# Patient Record
Sex: Male | Born: 1960 | Race: White | Hispanic: No | Marital: Married | Smoking: Never smoker
Health system: Southern US, Community
[De-identification: ages and names within clinical notes are randomized; demographics above are authoritative.]

## PROBLEM LIST (undated history)

## (undated) DIAGNOSIS — K5792 Diverticulitis of intestine, part unspecified, without perforation or abscess without bleeding: Secondary | ICD-10-CM

## (undated) DIAGNOSIS — C801 Malignant (primary) neoplasm, unspecified: Secondary | ICD-10-CM

## (undated) DIAGNOSIS — I10 Essential (primary) hypertension: Secondary | ICD-10-CM

## (undated) DIAGNOSIS — K529 Noninfective gastroenteritis and colitis, unspecified: Secondary | ICD-10-CM

---

## 2008-04-14 ENCOUNTER — Emergency Department (HOSPITAL_COMMUNITY): Admission: EM | Admit: 2008-04-14 | Discharge: 2008-04-14 | Payer: Self-pay | Admitting: Emergency Medicine

## 2011-06-04 LAB — URINALYSIS, ROUTINE W REFLEX MICROSCOPIC
Glucose, UA: NEGATIVE
Hgb urine dipstick: NEGATIVE
Ketones, ur: NEGATIVE
pH: 6

## 2011-06-04 LAB — DIFFERENTIAL
Basophils Absolute: 0
Basophils Relative: 0
Neutro Abs: 9 — ABNORMAL HIGH
Neutrophils Relative %: 76

## 2011-06-04 LAB — CBC
HCT: 43.8
Hemoglobin: 14.8
MCV: 93.3
RDW: 12.6

## 2011-06-04 LAB — COMPREHENSIVE METABOLIC PANEL
Alkaline Phosphatase: 34 — ABNORMAL LOW
BUN: 16
Chloride: 102
Creatinine, Ser: 1.05
Glucose, Bld: 105 — ABNORMAL HIGH
Potassium: 4.2
Total Bilirubin: 2.1 — ABNORMAL HIGH
Total Protein: 6.9

## 2011-06-04 LAB — LIPASE, BLOOD: Lipase: 15

## 2017-07-28 ENCOUNTER — Other Ambulatory Visit: Payer: Self-pay

## 2017-07-28 ENCOUNTER — Inpatient Hospital Stay (HOSPITAL_COMMUNITY): Payer: Managed Care, Other (non HMO)

## 2017-07-28 ENCOUNTER — Inpatient Hospital Stay (HOSPITAL_COMMUNITY)
Admission: EM | Admit: 2017-07-28 | Discharge: 2017-08-01 | DRG: 389 | Disposition: A | Discharge status: Home / Self Care | Payer: Managed Care, Other (non HMO) | Attending: General Surgery | Admitting: General Surgery

## 2017-07-28 ENCOUNTER — Encounter (HOSPITAL_COMMUNITY): Payer: Self-pay

## 2017-07-28 ENCOUNTER — Emergency Department (HOSPITAL_COMMUNITY): Payer: Managed Care, Other (non HMO)

## 2017-07-28 DIAGNOSIS — I10 Essential (primary) hypertension: Secondary | ICD-10-CM | POA: Diagnosis present

## 2017-07-28 DIAGNOSIS — Z887 Allergy status to serum and vaccine status: Secondary | ICD-10-CM

## 2017-07-28 DIAGNOSIS — K56609 Unspecified intestinal obstruction, unspecified as to partial versus complete obstruction: Secondary | ICD-10-CM | POA: Diagnosis present

## 2017-07-28 DIAGNOSIS — Z0189 Encounter for other specified special examinations: Secondary | ICD-10-CM

## 2017-07-28 DIAGNOSIS — Z91040 Latex allergy status: Secondary | ICD-10-CM | POA: Diagnosis not present

## 2017-07-28 DIAGNOSIS — Z888 Allergy status to other drugs, medicaments and biological substances status: Secondary | ICD-10-CM | POA: Diagnosis not present

## 2017-07-28 DIAGNOSIS — K565 Intestinal adhesions [bands], unspecified as to partial versus complete obstruction: Secondary | ICD-10-CM | POA: Diagnosis present

## 2017-07-28 DIAGNOSIS — C49A9 Gastrointestinal stromal tumor of other sites: Secondary | ICD-10-CM | POA: Diagnosis present

## 2017-07-28 HISTORY — DX: Diverticulitis of intestine, part unspecified, without perforation or abscess without bleeding: K57.92

## 2017-07-28 HISTORY — DX: Noninfective gastroenteritis and colitis, unspecified: K52.9

## 2017-07-28 HISTORY — DX: Essential (primary) hypertension: I10

## 2017-07-28 HISTORY — DX: Malignant (primary) neoplasm, unspecified: C80.1

## 2017-07-28 LAB — CBC
HEMATOCRIT: 45.6 % (ref 39.0–52.0)
HEMOGLOBIN: 16.1 g/dL (ref 13.0–17.0)
MCH: 32.7 pg (ref 26.0–34.0)
MCHC: 35.3 g/dL (ref 30.0–36.0)
MCV: 92.5 fL (ref 78.0–100.0)
Platelets: 227 10*3/uL (ref 150–400)
RBC: 4.93 MIL/uL (ref 4.22–5.81)
RDW: 12.6 % (ref 11.5–15.5)
WBC: 12.6 10*3/uL — AB (ref 4.0–10.5)

## 2017-07-28 LAB — COMPREHENSIVE METABOLIC PANEL
ALBUMIN: 4.8 g/dL (ref 3.5–5.0)
ALT: 27 U/L (ref 17–63)
ANION GAP: 12 (ref 5–15)
AST: 26 U/L (ref 15–41)
Alkaline Phosphatase: 47 U/L (ref 38–126)
BILIRUBIN TOTAL: 1.6 mg/dL — AB (ref 0.3–1.2)
BUN: 15 mg/dL (ref 6–20)
CO2: 21 mmol/L — ABNORMAL LOW (ref 22–32)
Calcium: 9.3 mg/dL (ref 8.9–10.3)
Chloride: 103 mmol/L (ref 101–111)
Creatinine, Ser: 1.01 mg/dL (ref 0.61–1.24)
GFR calc non Af Amer: >60 mL/min (ref >60)
GLUCOSE: 106 mg/dL — AB (ref 65–99)
POTASSIUM: 3.2 mmol/L — AB (ref 3.5–5.1)
SODIUM: 136 mmol/L (ref 135–145)
TOTAL PROTEIN: 7.3 g/dL (ref 6.5–8.1)

## 2017-07-28 LAB — URINALYSIS, ROUTINE W REFLEX MICROSCOPIC
Bilirubin Urine: NEGATIVE
Glucose, UA: NEGATIVE mg/dL
Hgb urine dipstick: NEGATIVE
Ketones, ur: 5 mg/dL — AB
LEUKOCYTES UA: NEGATIVE
NITRITE: NEGATIVE
PH: 6 (ref 5.0–8.0)
Protein, ur: NEGATIVE mg/dL
SPECIFIC GRAVITY, URINE: 1.024 (ref 1.005–1.030)

## 2017-07-28 LAB — I-STAT CHEM 8, ED
BUN: 15 mg/dL (ref 6–20)
CHLORIDE: 103 mmol/L (ref 101–111)
CREATININE: 0.9 mg/dL (ref 0.61–1.24)
Calcium, Ion: 1.11 mmol/L — ABNORMAL LOW (ref 1.15–1.40)
Glucose, Bld: 104 mg/dL — ABNORMAL HIGH (ref 65–99)
HEMATOCRIT: 48 % (ref 39.0–52.0)
Hemoglobin: 16.3 g/dL (ref 13.0–17.0)
POTASSIUM: 3.2 mmol/L — AB (ref 3.5–5.1)
SODIUM: 138 mmol/L (ref 135–145)
TCO2: 23 mmol/L (ref 22–32)

## 2017-07-28 LAB — LIPASE, BLOOD: Lipase: 18 U/L (ref 11–51)

## 2017-07-28 MED ORDER — SODIUM CHLORIDE 0.9 % IV BOLUS (SEPSIS)
1000.0000 mL | Freq: Once | INTRAVENOUS | Status: AC
  Administered 2017-07-28: 1000 mL via INTRAVENOUS

## 2017-07-28 MED ORDER — ACETAMINOPHEN 325 MG PO TABS
650.0000 mg | ORAL_TABLET | Freq: Four times a day (QID) | ORAL | Status: DC | PRN
  Administered 2017-07-29 – 2017-08-01 (×10): 650 mg via ORAL
  Filled 2017-07-28 (×11): qty 2

## 2017-07-28 MED ORDER — ENOXAPARIN SODIUM 40 MG/0.4ML ~~LOC~~ SOLN
40.0000 mg | SUBCUTANEOUS | Status: DC
  Administered 2017-07-29 – 2017-07-31 (×3): 40 mg via SUBCUTANEOUS
  Filled 2017-07-28 (×3): qty 0.4

## 2017-07-28 MED ORDER — MORPHINE SULFATE (PF) 4 MG/ML IV SOLN
4.0000 mg | Freq: Once | INTRAVENOUS | Status: AC
  Administered 2017-07-28: 4 mg via INTRAVENOUS
  Filled 2017-07-28: qty 1

## 2017-07-28 MED ORDER — SODIUM CHLORIDE 0.9 % IV SOLN
INTRAVENOUS | Status: DC
  Administered 2017-07-28 – 2017-07-29 (×2): via INTRAVENOUS

## 2017-07-28 MED ORDER — POTASSIUM CHLORIDE 10 MEQ/100ML IV SOLN
10.0000 meq | INTRAVENOUS | Status: AC
  Administered 2017-07-29 (×4): 10 meq via INTRAVENOUS
  Filled 2017-07-28 (×4): qty 100

## 2017-07-28 MED ORDER — LIDOCAINE HCL 2 % EX GEL
1.0000 "application " | Freq: Once | CUTANEOUS | Status: AC
  Administered 2017-07-28: 1 via TOPICAL
  Filled 2017-07-28: qty 20

## 2017-07-28 MED ORDER — HYDROMORPHONE HCL 1 MG/ML IJ SOLN
1.0000 mg | INTRAMUSCULAR | Status: DC | PRN
  Administered 2017-07-29 (×2): 1 mg via INTRAVENOUS
  Filled 2017-07-28 (×2): qty 1

## 2017-07-28 MED ORDER — ONDANSETRON HCL 4 MG/2ML IJ SOLN
4.0000 mg | Freq: Four times a day (QID) | INTRAMUSCULAR | Status: DC | PRN
  Administered 2017-07-28 – 2017-07-29 (×2): 4 mg via INTRAVENOUS
  Filled 2017-07-28 (×2): qty 2

## 2017-07-28 MED ORDER — ONDANSETRON HCL 4 MG/2ML IJ SOLN
4.0000 mg | Freq: Once | INTRAMUSCULAR | Status: AC
  Administered 2017-07-28: 4 mg via INTRAVENOUS
  Filled 2017-07-28: qty 2

## 2017-07-28 MED ORDER — ONDANSETRON 4 MG PO TBDP: 4.0000 mg | ORAL_TABLET | Freq: Four times a day (QID) | ORAL | Status: DC | PRN

## 2017-07-28 MED ORDER — ACETAMINOPHEN 650 MG RE SUPP: 650.0000 mg | Freq: Four times a day (QID) | RECTAL | Status: DC | PRN

## 2017-07-28 MED ORDER — DIATRIZOATE MEGLUMINE & SODIUM 66-10 % PO SOLN
90.0000 mL | Freq: Once | ORAL | Status: AC
  Administered 2017-07-29: 90 mL via NASOGASTRIC
  Filled 2017-07-28: qty 90

## 2017-07-28 NOTE — ED Notes (Signed)
Patient is stable and ready to be transport to the floor at this time.  Report was called to 6N RN.  Belongings taken with the patient to the floor.   

## 2017-07-28 NOTE — ED Triage Notes (Signed)
Pt states he has abdominal discomfort today around 1100. He has hx of diverticulitis and colitis. Pt reports normal bowel and bladder function. Pt also has hx of Cdiff,

## 2017-07-28 NOTE — ED Provider Notes (Signed)
[place] EMERGENCY DEPARTMENT Provider Note   CSN: [id number] Arrival date & time: 07/28/17  1601     History   Chief Complaint Chief Complaint  Patient presents with  . Abdominal Pain    HPI Antonio Yates is a 56 y.o. male.  56 yo M with a chief complaint of abdominal pain.  Started to the upper abdomen and now is diffusely lower.  Patient has had pain like this before and thought to be colitis or diverticulitis.  He denies fevers denies vomiting.  This been going on for about 6 hours now.  Gradually worsening.  Has had 2 bowel movements that were not dark or bloody.  Denies nausea.  The patient has a significant past medical history for GIST tumor, with multiple prior abdominal surgeries.  Passing flatus   The history is provided by the patient.  Abdominal Pain   This is a new problem. The current episode started 3 to 5 hours ago. The problem occurs constantly. The problem has been gradually worsening. The pain is located in the generalized abdominal region, LLQ and RLQ. The quality of the pain is sharp, shooting and cramping. The pain is at a severity of 9/10. The pain is severe. Pertinent negatives include fever, diarrhea, nausea, vomiting, headaches, arthralgias and myalgias. Nothing aggravates the symptoms. Nothing relieves the symptoms.    Past Medical History:  Diagnosis Date  . Cancer [facility])    GIST  . Colitis, acute   . Diverticulitis   . Hypertension     There are no active problems to display for this patient.   History reviewed. No pertinent surgical history.     Home Medications    Prior to Admission medications   Medication Sig Start Date End Date Taking? Authorizing Provider  amLODipine (NORVASC) 10 MG tablet Take 10 mg by mouth daily. 06/01/17  Yes [provider]  aspirin EC 81 MG tablet Take 81 mg by mouth daily.   Yes [provider]  potassium chloride SA (K-DUR,KLOR-CON) 20 MEQ tablet Take 20 mEq by mouth  daily. 07/03/17  Yes [provider]  STIVARGA 40 MG tablet Take 160 mg by mouth daily with breakfast.  06/29/17  Yes [provider]    Family History History reviewed. No pertinent family history.  Social History Social History   Tobacco Use  . Smoking status: Never Smoker  . Smokeless tobacco: Never Used  Substance Use Topics  . Alcohol use: Yes    Frequency: Never    Comment: occ  . Drug use: Not on file     Allergies   Flagyl er [metronidazole]; Tetanus toxoids; Theodrenaline; and Latex   Review of Systems Review of Systems  Constitutional: Negative for chills and fever.  HENT: Negative for congestion and facial swelling.   Eyes: Negative for discharge and visual disturbance.  Respiratory: Negative for shortness of breath.   Cardiovascular: Negative for chest pain and palpitations.  Gastrointestinal: Positive for abdominal pain. Negative for diarrhea, nausea and vomiting.  Musculoskeletal: Negative for arthralgias and myalgias.  Skin: Negative for color change and rash.  Neurological: Negative for tremors, syncope and headaches.  Psychiatric/Behavioral: Negative for confusion and dysphoric mood.     Physical Exam Updated Vital Signs BP (!) 169/98   Pulse 99   Temp 97.7 F (36.5 C) (Oral)   Resp 19   SpO2 97%   Physical Exam  Constitutional: He is oriented to person, place, and time. He appears well-developed and well-nourished.  HENT:  Head: Normocephalic and atraumatic.  Eyes: EOM are normal. Pupils are equal, round, and reactive to light.  Neck: Normal range of motion. Neck supple. No JVD present.  Cardiovascular: Normal rate and regular rhythm. Exam reveals no gallop and no friction rub.  No murmur heard. Pulmonary/Chest: No respiratory distress. He has no wheezes.  Abdominal: He exhibits no distension. There is tenderness in the right lower quadrant. There is no rebound, no guarding, no tenderness at McBurney's point and negative  Murphy's sign.  Large midline abdominal scar  Musculoskeletal: Normal range of motion.  Neurological: He is alert and oriented to person, place, and time.  Skin: No rash noted. No pallor.  Psychiatric: He has a normal mood and affect. His behavior is normal.  Nursing note and vitals reviewed.    ED Treatments / Results  Labs (all labs ordered are listed, but only abnormal results are displayed) Labs Reviewed  COMPREHENSIVE METABOLIC PANEL - Abnormal; Notable for the following components:      Result Value   Potassium 3.2 (*)    CO2 21 (*)    Glucose, Bld 106 (*)    Total Bilirubin 1.6 (*)    All other components within normal limits  CBC - Abnormal; Notable for the following components:   WBC 12.6 (*)    All other components within normal limits  URINALYSIS, ROUTINE W REFLEX MICROSCOPIC - Abnormal; Notable for the following components:   Color, Urine STRAW (*)    Ketones, ur 5 (*)    All other components within normal limits  I-STAT CHEM 8, ED - Abnormal; Notable for the following components:   Potassium 3.2 (*)    Glucose, Bld 104 (*)    Calcium, Ion 1.11 (*)    All other components within normal limits  LIPASE, BLOOD    EKG  EKG Interpretation None       Radiology Ct Abdomen Pelvis W Contrast  Result Date: 07/28/2017 CLINICAL DATA:  Unspecified abdominal pain. Acute low abdominal pain. History of gastrointestinal stromal tumor with bowel resections. EXAM: CT ABDOMEN AND PELVIS WITH CONTRAST TECHNIQUE: Multidetector CT imaging of the abdomen and pelvis was performed using the standard protocol following bolus administration of intravenous contrast. CONTRAST:  100 cc Isovue-300 intravenous COMPARISON:  04/14/2008. Report from 07/04/2017 at [place]. FINDINGS: Lower chest: No acute finding. Fatty scarring at the left ventricular apex raises the possibility of remote infarct, although not convincingly sub endocardial or thinned. Hepatobiliary: No visible  metastatic disease.Cholecystectomy with normal common bile duct diameter. Pancreas: Unremarkable. Spleen: Unremarkable. Adrenals/Urinary Tract: Negative adrenals. No hydronephrosis or stone. 1 cm cyst in the upper pole right kidney. Mild malrotation of the right kidney. Mass in the low right pelvis is indistinguishable from the right bladder wall, as below. Stomach/Bowel: There is a right lower quadrant bowel sutures, presumably enteroenteric or enterocolonic anastomosis related to history of gist. At or just proximal to this point there is small bowel distention with fluid levels and distal fecalized contents. No visible mass at the level of obstruction. There is a mass measuring 3.2 cm in the right pelvis, along the right urinary bladder. Colonic diverticulosis, overall mild. Vascular/Lymphatic: Atherosclerotic calcification of the aorta. No acute vascular finding. Reproductive:Negative Other: Small volume peritoneal fluid in the right lower quadrant which is considered reactive. Musculoskeletal: No acute or aggressive finding. IMPRESSION: 1. Small bowel obstruction with transition point in the right lower quadrant at or near bowel sutures. This could reflect stricture or adhesive disease. No masslike enhancement  at the level of obstruction. 2. History of gastrointestinal stromal tumor. There is a 3.2 cm soft tissue mass in the right lower quadrant indistinguishable from the right bladder wall. This mass is known and enlarging based on an abdominal CT report 07/04/2017 from [place]. 3. Chronic findings are noted above. Electronically Signed   By: Monte Fantasia M.D.   On: 07/28/2017 18:39    Procedures Procedures (including critical care time)  Medications Ordered in ED Medications  sodium chloride 0.9 % bolus 1,000 mL (0 mLs Intravenous Stopped 07/28/17 1939)  morphine 4 MG/ML injection 4 mg (4 mg Intravenous Given 07/28/17 1657)  ondansetron (ZOFRAN) injection 4 mg (4 mg Intravenous Given 07/28/17 1657)   morphine 4 MG/ML injection 4 mg (4 mg Intravenous Given 07/28/17 1927)  ondansetron (ZOFRAN) injection 4 mg (4 mg Intravenous Given 07/28/17 1927)     Initial Impression / Assessment and Plan / ED Course  I have reviewed the triage vital signs and the nursing notes.  Pertinent labs & imaging results that were available during my care of the patient were reviewed by me and considered in my medical decision making (see chart for details).     56 yo  M with a chief complaint of diffuse abdominal pain.  Started about 6 hours ago and progressively worsening.  Denies fevers nausea or vomiting.  The patient has severe pain to the right lower quadrant.  With his history of abdominal cancer will obtain a CT scan.  CT with SBO.  Discussed with Gen surgery.  Will admit.   The patients results and plan were reviewed and discussed.   Any x-rays performed were independently reviewed by myself.   Differential diagnosis were considered with the presenting HPI.  Medications  sodium chloride 0.9 % bolus 1,000 mL (0 mLs Intravenous Stopped 07/28/17 1939)  morphine 4 MG/ML injection 4 mg (4 mg Intravenous Given 07/28/17 1657)  ondansetron (ZOFRAN) injection 4 mg (4 mg Intravenous Given 07/28/17 1657)  morphine 4 MG/ML injection 4 mg (4 mg Intravenous Given 07/28/17 1927)  ondansetron (ZOFRAN) injection 4 mg (4 mg Intravenous Given 07/28/17 1927)    Vitals:   07/28/17 1645 07/28/17 1730 07/28/17 1931 07/28/17 1932  BP: (!) 166/89 (!) 168/92 (!) 169/98   Pulse: 90 91 89 99  Resp:   19   Temp:      TempSrc:      SpO2: 98% 96% 99% 97%    Final diagnoses:  SBO (small bowel obstruction) (HCC)    Admission/ observation were discussed with the admitting physician, patient and/or family and they are comfortable with the plan.    Final Clinical Impressions(s) / ED Diagnoses   Final diagnoses:  SBO (small bowel obstruction) [facility])    ED Discharge Orders    None       Deno Etienne,  DO 07/28/17 2016

## 2017-07-28 NOTE — H&P (Signed)
Antonio Yates is an 56 y.o. male.   Chief Complaint: abdominal pain HPI: 39 yom with complicated surgical history due to small bowel GIST.  Here is history from [facility] 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  01/28/2012: CT scan: a 5.6x4.8x7cm mass in the left central mesentery involving several small bowel, several nodules throughout the omentum concerning for peritoneal carcinomatosis  02/11/2012: exploratory laparotomy with resection of tumor and small intestine, small intestinal reanastomosis  Pathology showed high grade GIST, 5cm, 20-30/50 mitoses per high power field, 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 diff, treated with metronidazole and vancomycin  03/09/2012 - 03/15/2012: adjuvant imatinib 435m  Stopped due to drug rash, unclear if it was secondary to vancomycin or imatinib  04/05/2012 - 04/07/2012: imatinib 2039mstarted  04/08/2012: started imatinib 40082maily with plan for 3 years (planned end on 04/12/2015)  04/14/2015: Resection of intraabdominal tumor 5-10 cm and cholecystectomy  12/29/2015: recurrence on imaging  01/05/2016: started imatinib 400 mg BID  10/11/2016: CT AP showed mild growth of some intraperitoneal nodules  11/30/2016: resection of intrabdominal tumors, partial bladder resection. These are path results:  -1. Abdominal wall, nodule, excision:   - Gastrointestinal stromal tumor, spindle cell type, with therapy related   changes forming a 0.3 cm nodule.   - Approximately 5% of the tumor  cells are viable.    2. Tumor and bladder wall, excision:   - Multifocal gastrointestinal stromal tumor, spindle cell type, with   minimal therapy related changes, largest measuring 3.4 cm, involving   detrusor muscle and perivesicular adipose tissue.    Approximately 90% of the tumor cells are viable.   - Mitotic rate is 25 mitoses per 5mm27m  - Overlying bladder mucosa with reactive changes.    3. Mesentery, nodule, excision:   - Gastrointestinal stromal tumor, spindle cell type, with therapy related   changes forming a 0.5 cm nodule.   - Approximately 2% of the tumor cells are viable.    4. Peritoneum, nodules, excision:   - Multiple gastrointestinal stromal tumors, spindle cell type, with therapy   related changes, forming 10 discrete nodules ranging from 0.5 to 2.6 cm in   greatest dimension.   - Variable tumor viabiliy, ranging from 5% to 40%.   - Mitotic rate is 1 mitosis per 5mm243m  5. Bladder, additional nodule, excision:   - Gastrointestinal stromal tumor, spindle cell type, with therapy related   changes forming a 0.9 cm nodule.   - Approximately 5% of the tumor cells are viable.    Complicated by small bowel obstruction that was managed conservatively with eventual peg placement and tpn   Right now he is on regorafenib as 2nd line agent for gist.  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  [facility]  Late this morning he began having abdominal pain that was persisted. He has had two bms but is not passing flatus. Had some nausea, no emesis.  Came to er for evaluation given above history.  His ct  scan here shows following: 1. Small bowel obstruction with transition point in the right lower quadrant at or near bowel sutures. This could reflect stricture or adhesive disease. No masslike enhancement at the level of obstruction. 2. History of gastrointestinal stromal tumor. There is a 3.2 cm soft tissue mass in the right  lower quadrant indistinguishable from the right bladder wall. This mass is known and enlarging based on an abdominal CT report 07/04/2017 from [place].  Past Medical History:  Diagnosis Date  . Cancer [facility])    GIST  . Colitis, acute   . Diverticulitis   . Hypertension   history c diff  PSH: see above, complicated abdominal surgical history  History reviewed. No pertinent family history.   Social History:  reports that  has never smoked. he has never used smokeless tobacco. He reports that he drinks alcohol. His drug history is not on file.  Allergies:  Allergies  Allergen Reactions  . Flagyl Er [Metronidazole] Other (See Comments)    Red man syndrome  . Tetanus Toxoids Swelling    Severe swelling at the injection site  . Theodrenaline Other (See Comments)    Possible elevated HR  . Latex Rash    Meds reviewed  Results for orders placed or performed during the hospital encounter of 07/28/17 (from the past 48 hour(s))  Lipase, blood     Status: None   Collection Time: 07/28/17  4:28 PM  Result Value Ref Range   Lipase 18 11 - 51 U/L  Comprehensive metabolic panel     Status: Abnormal   Collection Time: 07/28/17  4:28 PM  Result Value Ref Range   Sodium 136 135 - 145 mmol/L   Potassium 3.2 (L) 3.5 - 5.1 mmol/L   Chloride 103 101 - 111 mmol/L   CO2 21 (L) 22 - 32 mmol/L   Glucose, Bld 106 (H) 65 - 99 mg/dL   BUN 15 6 - 20 mg/dL   Creatinine, Ser 1.01 0.61 - 1.24 mg/dL   Calcium 9.3 8.9 - 10.3 mg/dL   Total Protein 7.3 6.5 - 8.1 g/dL   Albumin 4.8 3.5 - 5.0 g/dL   AST 26 15 - 41 U/L   ALT 27 17 - 63 U/L   Alkaline Phosphatase 47 38 - 126 U/L   Total Bilirubin 1.6 (H) 0.3 - 1.2 mg/dL   GFR calc non Af Amer >60 >60 mL/min   GFR calc Af Amer >60 >60 mL/min    Comment: (NOTE) The eGFR has been calculated using the CKD EPI equation. This calculation has not been validated in all clinical situations. eGFR's persistently <60 mL/min signify possible Chronic  Kidney Disease.    Anion gap 12 5 - 15  CBC     Status: Abnormal   Collection Time: 07/28/17  4:28 PM  Result Value Ref Range   WBC 12.6 (H) 4.0 - 10.5 K/uL   RBC 4.93 4.22 - 5.81 MIL/uL   Hemoglobin 16.1 13.0 - 17.0 g/dL   HCT 45.6 39.0 - 52.0 %   MCV 92.5 78.0 - 100.0 fL   MCH 32.7 26.0 - 34.0 pg   MCHC 35.3 30.0 - 36.0 g/dL   RDW 12.6 11.5 - 15.5 %   Platelets 227 150 - 400 K/uL  I-Stat Chem 8, ED     Status: Abnormal   Collection Time: 07/28/17  4:52 PM  Result Value Ref Range   Sodium 138 135 - 145 mmol/L   Potassium 3.2 (L) 3.5 - 5.1 mmol/L  Chloride 103 101 - 111 mmol/L   BUN 15 6 - 20 mg/dL   Creatinine, Ser 0.90 0.61 - 1.24 mg/dL   Glucose, Bld 104 (H) 65 - 99 mg/dL   Calcium, Ion 1.11 (L) 1.15 - 1.40 mmol/L   TCO2 23 22 - 32 mmol/L   Hemoglobin 16.3 13.0 - 17.0 g/dL   HCT 48.0 39.0 - 52.0 %  Urinalysis, Routine w reflex microscopic     Status: Abnormal   Collection Time: 07/28/17  7:36 PM  Result Value Ref Range   Color, Urine STRAW (A) YELLOW   APPearance CLEAR CLEAR   Specific Gravity, Urine 1.024 1.005 - 1.030   pH 6.0 5.0 - 8.0   Glucose, UA NEGATIVE NEGATIVE mg/dL   Hgb urine dipstick NEGATIVE NEGATIVE   Bilirubin Urine NEGATIVE NEGATIVE   Ketones, ur 5 (A) NEGATIVE mg/dL   Protein, ur NEGATIVE NEGATIVE mg/dL   Nitrite NEGATIVE NEGATIVE   Leukocytes, UA NEGATIVE NEGATIVE   Ct Abdomen Pelvis W Contrast  Result Date: 07/28/2017 CLINICAL DATA:  Unspecified abdominal pain. Acute low abdominal pain. History of gastrointestinal stromal tumor with bowel resections. EXAM: CT ABDOMEN AND PELVIS WITH CONTRAST TECHNIQUE: Multidetector CT imaging of the abdomen and pelvis was performed using the standard protocol following bolus administration of intravenous contrast. CONTRAST:  100 cc Isovue-300 intravenous COMPARISON:  04/14/2008. Report from 07/04/2017 at [place]. FINDINGS: Lower chest: No acute finding. Fatty scarring at the left ventricular  apex raises the possibility of remote infarct, although not convincingly sub endocardial or thinned. Hepatobiliary: No visible metastatic disease.Cholecystectomy with normal common bile duct diameter. Pancreas: Unremarkable. Spleen: Unremarkable. Adrenals/Urinary Tract: Negative adrenals. No hydronephrosis or stone. 1 cm cyst in the upper pole right kidney. Mild malrotation of the right kidney. Mass in the low right pelvis is indistinguishable from the right bladder wall, as below. Stomach/Bowel: There is a right lower quadrant bowel sutures, presumably enteroenteric or enterocolonic anastomosis related to history of gist. At or just proximal to this point there is small bowel distention with fluid levels and distal fecalized contents. No visible mass at the level of obstruction. There is a mass measuring 3.2 cm in the right pelvis, along the right urinary bladder. Colonic diverticulosis, overall mild. Vascular/Lymphatic: Atherosclerotic calcification of the aorta. No acute vascular finding. Reproductive:Negative Other: Small volume peritoneal fluid in the right lower quadrant which is considered reactive. Musculoskeletal: No acute or aggressive finding. IMPRESSION: 1. Small bowel obstruction with transition point in the right lower quadrant at or near bowel sutures. This could reflect stricture or adhesive disease. No masslike enhancement at the level of obstruction. 2. History of gastrointestinal stromal tumor. There is a 3.2 cm soft tissue mass in the right lower quadrant indistinguishable from the right bladder wall. This mass is known and enlarging based on an abdominal CT report 07/04/2017 from [place]. 3. Chronic findings are noted above. Electronically Signed   By: Monte Fantasia M.D.   On: 07/28/2017 18:39    Review of Systems  Constitutional: Negative for chills and fever.  Gastrointestinal: Positive for abdominal pain and nausea. Negative for blood in stool, diarrhea and vomiting.  All other systems  reviewed and are negative.   Blood pressure (!) 169/98, pulse 99, temperature 97.7 F (36.5 C), temperature source Oral, resp. rate 19, SpO2 97 %. Physical Exam  Vitals reviewed. Constitutional: He is oriented to person, place, and time. He appears well-developed and well-nourished.  HENT:  Head: Normocephalic and atraumatic.  Mouth/Throat: Oropharynx is clear  and moist.  Eyes: No scleral icterus.  Neck: Neck supple.  Cardiovascular: Normal rate, regular rhythm, normal heart sounds and intact distal pulses.  Respiratory: Effort normal and breath sounds normal. He has no wheezes. He has no rales.  GI: He exhibits distension (mild lower abdomen). Bowel sounds are decreased. There is tenderness in the right lower quadrant. There is no rigidity, no rebound and no guarding. No hernia.    Lymphadenopathy:    He has no cervical adenopathy.  Neurological: He is alert and oriented to person, place, and time.  Skin: Skin is warm and dry. He is not diaphoretic.  Psychiatric: He has a normal mood and affect. His behavior is normal.     Assessment/Plan SBO  Has complicated surgical history with stage IV GIST managed at [facility]. Was being followed for pelvic nodule which looks like might be larger now.  The obstruction appears to be rlq and may be adhesion related as he has history of this and is certainly at risk for sbo secondary to adhesions. I do not think he needs surgery right now.  I do think conservative mgt of this hopefully adhesive sbo is appropriate. I discussed ng placement today with gastrograffin protocol to see if this resolves.  We discussed indications for surgery being worsening or no improvement over time.  We can certainly try to discuss with his surgeon at home. Ideally having him back at home for surgery given history would be appropriate as he has been managed for years with this recurrent stage IV GIST.   I discussed this plan with he and his wife.    Rolm Bookbinder,  MD 07/28/2017, 8:27 PM

## 2017-07-28 NOTE — ED Notes (Signed)
Patient taken to CT.

## 2017-07-29 ENCOUNTER — Inpatient Hospital Stay (HOSPITAL_COMMUNITY): Payer: Managed Care, Other (non HMO)

## 2017-07-29 LAB — BASIC METABOLIC PANEL
Anion gap: 10 (ref 5–15)
BUN: 10 mg/dL (ref 6–20)
CHLORIDE: 101 mmol/L (ref 101–111)
CO2: 25 mmol/L (ref 22–32)
CREATININE: 0.93 mg/dL (ref 0.61–1.24)
Calcium: 8.5 mg/dL — ABNORMAL LOW (ref 8.9–10.3)
GFR calc non Af Amer: >60 mL/min (ref >60)
GLUCOSE: 131 mg/dL — AB (ref 65–99)
Potassium: 3.6 mmol/L (ref 3.5–5.1)
Sodium: 136 mmol/L (ref 135–145)

## 2017-07-29 LAB — CBC
HCT: 44.5 % (ref 39.0–52.0)
Hemoglobin: 15.5 g/dL (ref 13.0–17.0)
MCH: 32.4 pg (ref 26.0–34.0)
MCHC: 34.8 g/dL (ref 30.0–36.0)
MCV: 92.9 fL (ref 78.0–100.0)
Platelets: 215 10*3/uL (ref 150–400)
RBC: 4.79 MIL/uL (ref 4.22–5.81)
RDW: 12.5 % (ref 11.5–15.5)
WBC: 12.3 10*3/uL — ABNORMAL HIGH (ref 4.0–10.5)

## 2017-07-29 MED ORDER — CHLORHEXIDINE GLUCONATE 0.12 % MT SOLN
15.0000 mL | Freq: Two times a day (BID) | OROMUCOSAL | Status: DC
  Administered 2017-07-29 – 2017-07-31 (×6): 15 mL via OROMUCOSAL
  Filled 2017-07-29 (×4): qty 15

## 2017-07-29 MED ORDER — KCL-LACTATED RINGERS-D5W 20 MEQ/L IV SOLN
INTRAVENOUS | Status: DC
  Administered 2017-07-29 – 2017-07-30 (×3): via INTRAVENOUS
  Filled 2017-07-29 (×4): qty 1000

## 2017-07-29 MED ORDER — PHENOL 1.4 % MT LIQD
1.0000 | OROMUCOSAL | Status: DC | PRN
  Filled 2017-07-29 (×2): qty 177

## 2017-07-29 MED ORDER — ORAL CARE MOUTH RINSE
15.0000 mL | Freq: Two times a day (BID) | OROMUCOSAL | Status: DC
  Administered 2017-07-29 – 2017-07-31 (×5): 15 mL via OROMUCOSAL

## 2017-07-29 MED ORDER — MENTHOL 3 MG MT LOZG
1.0000 | LOZENGE | OROMUCOSAL | Status: DC | PRN
  Administered 2017-07-29: 3 mg via ORAL
  Filled 2017-07-29 (×2): qty 9

## 2017-07-29 MED ORDER — PROMETHAZINE HCL 25 MG/ML IJ SOLN: 12.5000 mg | Freq: Once | INTRAMUSCULAR | Status: DC

## 2017-07-29 MED ORDER — HYDRALAZINE HCL 20 MG/ML IJ SOLN
10.0000 mg | Freq: Four times a day (QID) | INTRAMUSCULAR | Status: DC | PRN
  Administered 2017-07-29: 10 mg via INTRAVENOUS
  Filled 2017-07-29: qty 1

## 2017-07-29 NOTE — Progress Notes (Signed)
CC: Abdominal pain with a history of gist tumors small bowel/multiple abdominal surgeries.  Subjective: He looks extremely good for someone is been through his much as he has.  No real change in his abdominal exam.  He says he feels better though.  There is no contrast seen in the post films from his small bowel protocol.  Objective: Vital signs in last 24 hours: Temp:  [97.7 F (36.5 C)-98.6 F (37 C)] 98.4 F (36.9 C) (11/23 0824) Pulse Rate:  [88-104] 88 (11/23 0824) Resp:  [16-19] 18 (11/23 0655) BP: (151-170)/(87-105) 160/87 (11/23 0824) SpO2:  [96 %-100 %] 100 % (11/23 0824) Last BM Date: 07/28/17 NPO 2030 IV 1600 urine 600 per ng Afebrile, blood pressure is elevated. Electrolytes are stable WBC still elevated at 12.3. Film this AM from SB protocol shows no contrast at all.  SBO persisit  Intake/Output from previous day: 11/22 0701 - 11/23 0700 In: 2030 [I.V.:640; NG/GT:90; IV Piggyback:1300] Out: 2200 [Urine:1600; Emesis/NG output:600] Intake/Output this shift: Total I/O In: 300 [NG/GT:300] Out: 300 [Emesis/NG output:300]  General appearance: alert, cooperative and no distress Resp: clear to auscultation bilaterally GI: Minimal distention, no bowel sounds, no flatus no bowel movement.  Lab Results:  Recent Labs    07/28/17 1628 07/28/17 1652 07/29/17 0636  WBC 12.6*  --  12.3*  HGB 16.1 16.3 15.5  HCT 45.6 48.0 44.5  PLT 227  --  215    BMET Recent Labs    07/28/17 1628 07/28/17 1652 07/29/17 0636  NA 136 138 136  K 3.2* 3.2* 3.6  CL 103 103 101  CO2 21*  --  25  GLUCOSE 106* 104* 131*  BUN 15 15 10   CREATININE 1.01 0.90 0.93  CALCIUM 9.3  --  8.5*   PT/INR No results for input(s): LABPROT, INR in the last 72 hours.  Recent Labs  Lab 07/28/17 1628  AST 26  ALT 27  ALKPHOS 47  BILITOT 1.6*  PROT 7.3  ALBUMIN 4.8     Lipase     Component Value Date/Time   LIPASE 18 07/28/2017 1628     Prior to Admission medications    Medication Sig Start Date End Date Taking? Authorizing Provider  amLODipine (NORVASC) 10 MG tablet Take 10 mg by mouth daily. 06/01/17  Yes [provider]  aspirin EC 81 MG tablet Take 81 mg by mouth daily.   Yes [provider]  potassium chloride SA (K-DUR,KLOR-CON) 20 MEQ tablet Take 20 mEq by mouth daily. 07/03/17  Yes [provider]  STIVARGA 40 MG tablet Take 160 mg by mouth daily with breakfast.  06/29/17  Yes [provider]    Medications: . chlorhexidine  15 mL Mouth Rinse BID  . enoxaparin (LOVENOX) injection  40 mg Subcutaneous Q24H  . mouth rinse  15 mL Mouth Rinse q12n4p  . promethazine  12.5 mg Intravenous Once   . 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 difficile colitis. Hypertension  FEN:  IV fluids/NPo ID:  None DVT: Lovenox Foley: None Follow-up: To be determined  Plan: We will put him on some Apresoline for his blood pressure.  I am going to treat him IV for right now.  He is asking to get up and walk around.  He notes his prior small bowel obstructions took a long time to resolve.  He is asking if is possible he could drive up to [place] and return to [place] and his physicians there.  At this point I recommended he get up and walk around as much as he likes.  We will continue to hydrate with IV fluids, continue decompression, and get him through the weekend to see how he does.  If for no other reason the traffic will be better after the weekend.  LOS: 1 day    Eily Louvier 07/29/2017 [id number]

## 2017-07-30 ENCOUNTER — Other Ambulatory Visit: Payer: Self-pay

## 2017-07-30 ENCOUNTER — Inpatient Hospital Stay (HOSPITAL_COMMUNITY): Payer: Managed Care, Other (non HMO)

## 2017-07-30 LAB — CBC
HCT: 40.6 % (ref 39.0–52.0)
Hemoglobin: 13.9 g/dL (ref 13.0–17.0)
MCH: 32.3 pg (ref 26.0–34.0)
MCHC: 34.2 g/dL (ref 30.0–36.0)
MCV: 94.2 fL (ref 78.0–100.0)
PLATELETS: 175 10*3/uL (ref 150–400)
RBC: 4.31 MIL/uL (ref 4.22–5.81)
RDW: 12.6 % (ref 11.5–15.5)
WBC: 8.3 10*3/uL (ref 4.0–10.5)

## 2017-07-30 LAB — BASIC METABOLIC PANEL
ANION GAP: 6 (ref 5–15)
BUN: 8 mg/dL (ref 6–20)
CALCIUM: 8.1 mg/dL — AB (ref 8.9–10.3)
CO2: 26 mmol/L (ref 22–32)
Chloride: 106 mmol/L (ref 101–111)
Creatinine, Ser: 0.94 mg/dL (ref 0.61–1.24)
GLUCOSE: 115 mg/dL — AB (ref 65–99)
POTASSIUM: 3.2 mmol/L — AB (ref 3.5–5.1)
Sodium: 138 mmol/L (ref 135–145)

## 2017-07-30 LAB — MAGNESIUM: Magnesium: 2.1 mg/dL (ref 1.7–2.4)

## 2017-07-30 MED ORDER — POTASSIUM CHLORIDE 10 MEQ/100ML IV SOLN
10.0000 meq | INTRAVENOUS | Status: AC
  Administered 2017-07-30 (×6): 10 meq via INTRAVENOUS
  Filled 2017-07-30 (×5): qty 100

## 2017-07-30 MED ORDER — DIPHENHYDRAMINE HCL 50 MG/ML IJ SOLN
25.0000 mg | Freq: Once | INTRAMUSCULAR | Status: DC
  Filled 2017-07-30: qty 1

## 2017-07-30 MED ORDER — POTASSIUM CHLORIDE 2 MEQ/ML IV SOLN
INTRAVENOUS | Status: DC
  Administered 2017-07-30 – 2017-07-31 (×4): via INTRAVENOUS
  Filled 2017-07-30 (×8): qty 1000

## 2017-07-30 NOTE — Progress Notes (Signed)
Subjective: Feeling better.  Much less pain.  Had a small bowel movement and passing flatus. Abdominal x-ray showed normal bowel gas pattern.  No evidence of obstruction WBC down from [phone].  Potassium 3.2  I discussed all of this with him.  We discussed the option of pulling the NG tube and discharging him so that he could go back to [place] to his medical oncologist and surgical oncologist.  We also discussed the option of pulling the NG tube and starting a clear liquid diet today and keeping him here It was his preference to leave the NG tube because previous SBO's have been slow to resolve.  He wants to stay here at least total tomorrow.  His preference would be to travel back to [facility] Monday. That is also reasonable, Especially considering the rainstorm that we are having today - Objective: Vital signs in last 24 hours: Temp:  [98.2 F (36.8 C)-98.4 F (36.9 C)] 98.4 F (36.9 C) (11/24 0544) Pulse Rate:  [79-82] 79 (11/24 0544) Resp:  [18] 18 (11/24 0544) BP: (143-148)/(79-94) 148/94 (11/24 0544) SpO2:  [97 %-99 %] 98 % (11/24 0544) Weight:  [95.6 kg (210 lb 12.2 oz)] 95.6 kg (210 lb 12.2 oz) (11/24 0749) Last BM Date: 07/28/17  Intake/Output from previous day: 11/23 0701 - 11/24 0700 In: 1050 [I.V.:750; NG/GT:300] Out: 1850 [Urine:1200; Emesis/NG output:650] Intake/Output this shift: No intake/output data recorded.   EXAM: General appearance: alert, cooperative and no distress Resp: clear to auscultation bilaterally GI: Minimal distention, no bowel sounds, no flatus no bowel movement. Mild subjected right-sided tenderness     Lab Results:  Recent Labs    07/29/17 0636 07/30/17 0530  WBC 12.3* 8.3  HGB 15.5 13.9  HCT 44.5 40.6  PLT 215 175   BMET Recent Labs    07/29/17 0636 07/30/17 0530  NA 136 138  K 3.6 3.2*  CL 101 106  CO2 25 26  GLUCOSE 131* 115*  BUN 10 8  CREATININE 0.93 0.94  CALCIUM 8.5* 8.1*   PT/INR No results for  input(s): LABPROT, INR in the last 72 hours. ABG No results for input(s): PHART, HCO3 in the last 72 hours.  Invalid input(s): PCO2, PO2  Studies/Results: Ct Abdomen Pelvis W Contrast  Result Date: 07/28/2017 CLINICAL DATA:  Unspecified abdominal pain. Acute low abdominal pain. History of gastrointestinal stromal tumor with bowel resections. EXAM: CT ABDOMEN AND PELVIS WITH CONTRAST TECHNIQUE: Multidetector CT imaging of the abdomen and pelvis was performed using the standard protocol following bolus administration of intravenous contrast. CONTRAST:  100 cc Isovue-300 intravenous COMPARISON:  04/14/2008. Report from 07/04/2017 at [place]. FINDINGS: Lower chest: No acute finding. Fatty scarring at the left ventricular apex raises the possibility of remote infarct, although not convincingly sub endocardial or thinned. Hepatobiliary: No visible metastatic disease.Cholecystectomy with normal common bile duct diameter. Pancreas: Unremarkable. Spleen: Unremarkable. Adrenals/Urinary Tract: Negative adrenals. No hydronephrosis or stone. 1 cm cyst in the upper pole right kidney. Mild malrotation of the right kidney. Mass in the low right pelvis is indistinguishable from the right bladder wall, as below. Stomach/Bowel: There is a right lower quadrant bowel sutures, presumably enteroenteric or enterocolonic anastomosis related to history of gist. At or just proximal to this point there is small bowel distention with fluid levels and distal fecalized contents. No visible mass at the level of obstruction. There is a mass measuring 3.2 cm in the right pelvis, along the right urinary bladder. Colonic diverticulosis, overall mild. Vascular/Lymphatic: Atherosclerotic calcification of  the aorta. No acute vascular finding. Reproductive:Negative Other: Small volume peritoneal fluid in the right lower quadrant which is considered reactive. Musculoskeletal: No acute or aggressive finding. IMPRESSION: 1. Small  bowel obstruction with transition point in the right lower quadrant at or near bowel sutures. This could reflect stricture or adhesive disease. No masslike enhancement at the level of obstruction. 2. History of gastrointestinal stromal tumor. There is a 3.2 cm soft tissue mass in the right lower quadrant indistinguishable from the right bladder wall. This mass is known and enlarging based on an abdominal CT report 07/04/2017 from [place]. 3. Chronic findings are noted above. Electronically Signed   By: Monte Fantasia M.D.   On: 07/28/2017 18:39   Dg Abd 2 Views  Result Date: 07/30/2017 CLINICAL DATA:  56 year old male recently diagnosis small bowel obstruction. Patient reportedly had a large bowel movement today. Right lower abdominal pain. EXAM: ABDOMEN - 2 VIEW COMPARISON:  Abdominal radiograph 07/29/2017. FINDINGS: Nasogastric tube tip in the distal body of the stomach. Small amount of gas and stool is noted throughout the colon. No pathologic dilatation of small bowel identified on today's examination. No gross pneumoperitoneum. Large right-sided pelvic phlebolith incidentally noted. Surgical clip projecting over the right upper quadrant, compatible with prior cholecystectomy. IMPRESSION: 1. Nonobstructive bowel gas pattern.  No pneumoperitoneum. 2. Support apparatus and postoperative changes, as above. Electronically Signed   By: Vinnie Langton M.D.   On: 07/30/2017 10:12   Dg Abd Portable 1v-small Bowel Obstruction Protocol-initial, 8 Hr Delay  Result Date: 07/29/2017 CLINICAL DATA:  Small bowel obstruction. EXAM: PORTABLE ABDOMEN - 1 VIEW COMPARISON:  Radiographs and CT scan dated 07/28/2017 FINDINGS: The NG tube has been advanced into the fundus of the stomach. There is a dilated loop of small bowel in the left upper quadrant. There is air in the nondistended hepatic flexure of the colon. No acute bone abnormality. IMPRESSION: Persistent small bowel obstruction. Electronically Signed   By: Lorriane Shire M.D.   On: 07/29/2017 09:09   Dg Abd Portable 1v-small Bowel Protocol-position Verification  Result Date: 07/28/2017 CLINICAL DATA:  NG tube placement EXAM: PORTABLE ABDOMEN - 1 VIEW COMPARISON:  None. FINDINGS: The tip of the nasogastric tube is just below the gastroesophageal junction. The side port is in the distal esophagus. The tube should be advanced by approximately 7 cm. The lung bases are clear. Unremarkable upper abdomen. IMPRESSION: Nasogastric tube side port within the distal esophagus. Advancement by 7-10 cm is recommended. Electronically Signed   By: Ulyses Jarred M.D.   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 -His preference would be to leave the NG tube and one more day because of prior experience.  That is reasonable -no narcotics -Ambulate -Lab and x-ray tomorrow -if it looks like he is continuing to resolve, we will pull the NG tube out and let him go back to [facility] Monday  History of diverticulitis History of C. difficile colitis. Hypertension  FEN:  IV fluids/NPo ID:  None DVT: Lovenox Foley: None Follow-up: To be determined, But probably at the [facility] Monday     LOS: 2 days    Adin Hector 07/30/2017

## 2017-07-31 ENCOUNTER — Inpatient Hospital Stay (HOSPITAL_COMMUNITY): Payer: Managed Care, Other (non HMO)

## 2017-07-31 LAB — CBC
HEMATOCRIT: 44 % (ref 39.0–52.0)
Hemoglobin: 14.6 g/dL (ref 13.0–17.0)
MCH: 31.7 pg (ref 26.0–34.0)
MCHC: 33.2 g/dL (ref 30.0–36.0)
MCV: 95.7 fL (ref 78.0–100.0)
PLATELETS: 192 10*3/uL (ref 150–400)
RBC: 4.6 MIL/uL (ref 4.22–5.81)
RDW: 12.6 % (ref 11.5–15.5)
WBC: 9.3 10*3/uL (ref 4.0–10.5)

## 2017-07-31 LAB — BASIC METABOLIC PANEL
ANION GAP: 8 (ref 5–15)
BUN: 8 mg/dL (ref 6–20)
CALCIUM: 8.5 mg/dL — AB (ref 8.9–10.3)
CO2: 27 mmol/L (ref 22–32)
CREATININE: 0.98 mg/dL (ref 0.61–1.24)
Chloride: 105 mmol/L (ref 101–111)
GLUCOSE: 98 mg/dL (ref 65–99)
Potassium: 3.5 mmol/L (ref 3.5–5.1)
Sodium: 140 mmol/L (ref 135–145)

## 2017-07-31 MED ORDER — POTASSIUM CHLORIDE CRYS ER 20 MEQ PO TBCR
20.0000 meq | EXTENDED_RELEASE_TABLET | Freq: Every day | ORAL | Status: DC
  Administered 2017-08-01: 20 meq via ORAL
  Filled 2017-07-31: qty 1

## 2017-07-31 MED ORDER — AMLODIPINE BESYLATE 5 MG PO TABS: 5.0000 mg | ORAL_TABLET | Freq: Every day | ORAL | Status: DC

## 2017-07-31 MED ORDER — ACETAMINOPHEN 325 MG PO TABS: 650.0000 mg | ORAL_TABLET | Freq: Four times a day (QID) | ORAL | Status: AC | PRN

## 2017-07-31 MED ORDER — AMLODIPINE BESYLATE 10 MG PO TABS
10.0000 mg | ORAL_TABLET | Freq: Every day | ORAL | Status: DC
Start: 2017-08-01 — End: 2017-08-01
  Administered 2017-08-01: 10 mg via ORAL
  Filled 2017-07-31: qty 1

## 2017-07-31 NOTE — Discharge Summary (Addendum)
Patient ID: Antonio Yates [id number] 56 y.o. 09-27-1960  Admit date: 07/28/2017  Discharge date and time: 08/01/2017  Admitting Physician: Rolm Bookbinder  Discharge Physician: Adin Hector  Admission Diagnoses: SBO (small bowel obstruction) [facility]) [V25.366]  Discharge Diagnoses: SBO                                         Stage !V GIST tumor abdomen                                         History of multiple, at least 5, abdominal procedures for gastrointestinal stromal tumor, spindle cell type                                          History chemotherapy for GI ST tumor  Operations: none  Admission Condition: good  Discharged Condition: good  Indication for Admission: This is a 56 year old gentleman from [place].  He was in town visiting family.  He was in the emergency room with abdominal pain nausea and vomiting.       In the emergency department he was found to be a pleasant gentleman.  His wife is present.  Abdominal distention the lower abdomen, decreased bowel sounds.  Tenderness in the right lower quadrant but no rigidity rebound or guarding.  Vertical midline scar.  Transverse lower abdominal scar.  No hernia.  No lymphadenopathy.    Lab work revealed hemoglobin 16.1.  WBC 12,600.  Metabolic panel revealed total bilirubin 1.6 but LFTs otherwise normal.  Potassium 3.2.  Creatinine 1.01.  Urinalysis unremarkable.  CT scan showed small bowel obstruction with transition point in the right lower quadrant near bowel sutures.  Adhesive disease was postulated.  No mass at the level of obstruction.  There was a 3.2 cm soft tissue mass in the right lower quadrant indistinguishable from the right bladder wall.  This mass is known and enlarging based on abdominal CT report from 07/04/2017 from [place].      Past history is significant for 5 abdominal procedures for recurrent GI ST tumor.  They are detailed in his admission history.  He was admitted for further  evaluation and management.  Hospital Course: A nasogastric tube was placed.  IV fluids were started.  The patient was admitted to our general surgery group.  Over the next 2 days the patient improved.  His pain resolved.  His distention significantly improved.  The day prior to discharge he was  was passing flatus and had a stool although it was not large.  His abdominal x-rays on 07/30/2017 showed unremarkable bowel gas pattern.  He wanted to leave the NG tube in stating that prior bowel obstructions have taken a long time to get.better.     On 07/31/2017 his abdominal x-rays showed unremarkable bowel gas pattern.  Hemoglobin 14.6.  WBC 9300.  Electrolytes normal.     He stated that he wanted to travel back to [place] on Monday morning, November 26 and wanted to leave early in the morning and his wife drive him.  He wanted to leave the NG tube in place. I called his surgeon today at the [facility].   Dr. Herbie [place]  [place]. [phone].   We discussed the patient's clinical course and the patient's plans to travel back to [facility] Monday morning.  He said he would be happy to see the patient in the emergency room when he arrives.  The patient was instructed to go to the [facility] emergency room and Dr. Lorretta Harp will be called.  We have given him a CD of all of his x-ray images.  He will be given a copy of the history and physical and discharge summary.     At the time of discharge the patient still has NG tube as he did not want to remove it.  His abdomen was soft.  Nontender.  Nondistended and there were some bowel sounds present.  Abdominal x-rays were unremarkable today.  Lab work today was unremarkable.  His wife will drive him back to [place] first thing tomorrow morning.  I feel this is perfectly safe as the patient shows no sign of obstruction or bowel compromise clinically at this time.  Consults: None  Significant Diagnostic Studies: Abdominal x-rays and CT  scans.  Treatments: IV hydration, nasogastric suction.  Disposition: The patient will drive back to his home in [place] and presented to the emergency department at the [facility] where he will be evaluated by Dr. Lorretta Harp and his staff  Patient Instructions: Go directly to the emergency department at the [facility] upon arrival   Activity: Unlimited ambulation. Diet: NPO Wound Care: none  Follow-up:  With Dr. Jeralyn Ruths, [facility] in 1 day.  Signed: Edsel Petrin. Dalbert Batman, M.D., [facility] General and Minimally invasive Surgery Breast and Colorectal Surgery Office:   [phone] Pager:   [phone]   07/31/2017, 1:10 PM

## 2017-07-31 NOTE — Progress Notes (Signed)
[facility] Progress Note     Subjective: CC:  Had a BM and flatus yesterday but no BM today and less flatus. NGT with 1500 cc/24h. Denies abdominal pain.   Antonio Yates requesting to keep NG tube in place at hospital discharge and possibly manually suction out gastric contents PRN until he is admitted to hospital in [place].   Objective: Vital signs in last 24 hours: Temp:  [98.5 F (36.9 C)-99.1 F (37.3 C)] 98.5 F (36.9 C) (11/25 0504) Pulse Rate:  [66-72] 66 (11/25 0504) Resp:  [18] 18 (11/25 0504) BP: (148-153)/(84-93) 148/84 (11/25 0504) SpO2:  [97 %-99 %] 99 % (11/25 0504) Last BM Date: 07/30/17  Intake/Output from previous day: 11/24 0701 - 11/25 0700 In: 2723 [P.O.:40; I.V.:2483; IV Piggyback:200] Out: 1552 [Urine:2; Emesis/NG output:1550] Intake/Output this shift: No intake/output data recorded.  PE: General appearance:alert, cooperative and no distress Resp:clear to auscultation bilaterally, normal effort TT:[id number] distention, hypoactive bowel sounds,  Mild subjective right-sided tenderness Psych: A&Ox3    Lab Results:  Recent Labs    07/30/17 0530 07/31/17 0703  WBC 8.3 9.3  HGB 13.9 14.6  HCT 40.6 44.0  PLT 175 192   BMET Recent Labs    07/30/17 0530 07/31/17 0703  NA 138 140  K 3.2* 3.5  CL 106 105  CO2 26 27  GLUCOSE 115* 98  BUN 8 8  CREATININE 0.94 0.98  CALCIUM 8.1* 8.5*   Antonio Yates/INR No results for input(s): LABPROT, INR in the last 72 hours. CMP     Component Value Date/Time   NA 140 07/31/2017 0703   K 3.5 07/31/2017 0703   CL 105 07/31/2017 0703   CO2 27 07/31/2017 0703   GLUCOSE 98 07/31/2017 0703   BUN 8 07/31/2017 0703   CREATININE 0.98 07/31/2017 0703   CALCIUM 8.5 (L) 07/31/2017 0703   PROT 7.3 07/28/2017 1628   ALBUMIN 4.8 07/28/2017 1628   AST 26 07/28/2017 1628   ALT 27 07/28/2017 1628   ALKPHOS 47 07/28/2017 1628   BILITOT 1.6 (H) 07/28/2017 1628   GFRNONAA >60 07/31/2017 0703   GFRAA >60 07/31/2017 0703    Lipase     Component Value Date/Time   LIPASE 18 07/28/2017 1628       Studies/Results: Dg Abd 2 Views  Result Date: 07/31/2017 CLINICAL DATA:  Small bowel obstruction. EXAM: ABDOMEN - 2 VIEW COMPARISON:  07/30/2017 FINDINGS: Enteric tube terminates in the region of the distal stomach or proximal duodenum, more distal than on the prior study. There is no evidence of intraperitoneal free air. A small amount of gas and stool in the colon are similar to the prior study. No dilated bowel loops are seen to suggest obstruction. Pelvic calcifications are unchanged and likely represent phleboliths. Suture material is noted in the right lower quadrant, and there is a surgical clip in the right upper quadrant. The visualized lung bases are clear. No acute osseous abnormality is seen. IMPRESSION: Enteric tube as above.  Nonobstructed bowel gas pattern. Electronically Signed   By: Logan Bores M.D.   On: 07/31/2017 08:19   Dg Abd 2 Views  Result Date: 07/30/2017 CLINICAL DATA:  56 year old male recently diagnosis small bowel obstruction. Patient reportedly had a large bowel movement today. Right lower abdominal pain. EXAM: ABDOMEN - 2 VIEW COMPARISON:  Abdominal radiograph 07/29/2017. FINDINGS: Nasogastric tube tip in the distal body of the stomach. Small amount of gas and stool is noted throughout the colon. No pathologic dilatation of small bowel identified  on today's examination. No gross pneumoperitoneum. Large right-sided pelvic phlebolith incidentally noted. Surgical clip projecting over the right upper quadrant, compatible with prior cholecystectomy. IMPRESSION: 1. Nonobstructive bowel gas pattern.  No pneumoperitoneum. 2. Support apparatus and postoperative changes, as above. Electronically Signed   By: Vinnie Langton M.D.   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 - continue NGT per patient preference but did discuss the possibility of clamping trial and checking residuals later today if patient unable to leave hospital with NG.  - no narcotics  - Ambulate - plan to discharge home early tomorrow morning so patient cant seek care from previous surgeon in [place]. Dr. Dalbert Batman to call patients surgeon today to discuss patient care. I will have radiology make discs of imaging performed during this hospitalization to send with Antonio Yates.   History of diverticulitis History of C. difficile colitis. Hypertension  FEN:IV fluids/NPO; clamping trial this afternoon vs continue NGT? FI:EPPI DVT: Lovenox Foley: None Follow-up: [facility] Monday (tomorrow)    LOS: 3 days    [name] , [facility] 07/31/2017, 11:27 AM Pager: [phone] Consults: [phone] Mon-Fri 7:00 am-4:30 pm Sat-Sun 7:00 am-11:30 am

## 2017-08-01 NOTE — Progress Notes (Signed)
Pt demonstrated his ability to give own meds through NG tube. He is prepared to leave for [place] as soon as his wife arrives. He has all the NG supplies for his trip.

## 2019-01-26 IMAGING — CT CT ABD-PELV W/ CM
2 of 5 series · 16 of 46 positions shown, 18 images · IV contrast (APPLIED)
Comparison: 04/14/2008. Report from 07/04/2017 at [[facility]].

CLINICAL DATA: Unspecified abdominal pain. Acute low abdominal
pain. History of gastrointestinal stromal tumor with bowel
resections.

EXAM:
CT ABDOMEN AND PELVIS WITH CONTRAST
TECHNIQUE: Multidetector CT imaging of the abdomen and pelvis was performed
using the standard protocol following bolus administration of
intravenous contrast.
CONTRAST:  100 cc Ssovue-I44 intravenous

[Series 3: abdomen 5.0 · axial · 0.76mm/px · z∈[+784,+1184]mm · 13 of 94 slices shown, 15 images]
[im 7/94  soft-tissue]
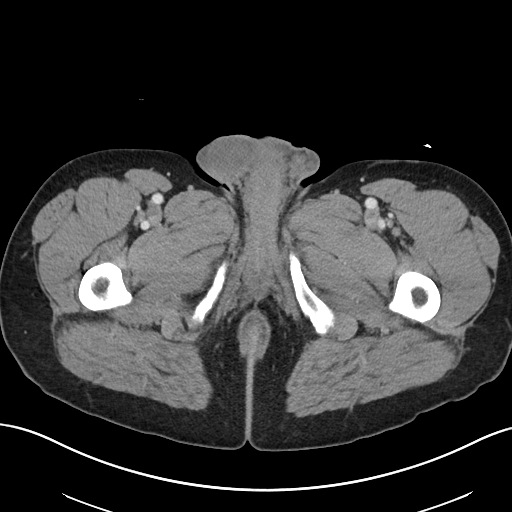
[im 7/94  bone]
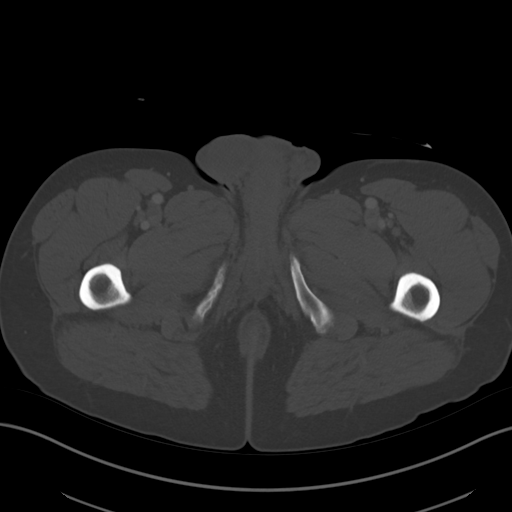
[im 13/94  soft-tissue]
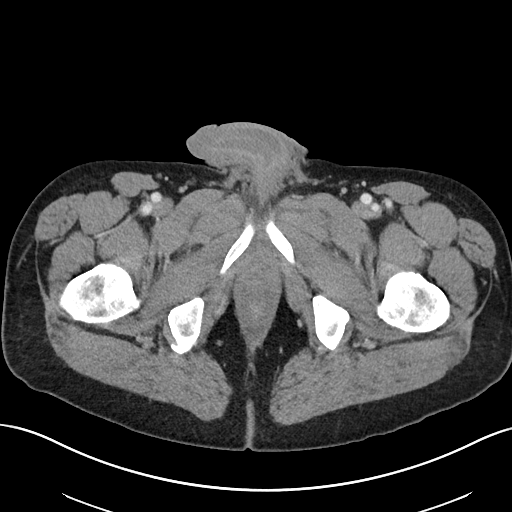
[im 19/94  soft-tissue]
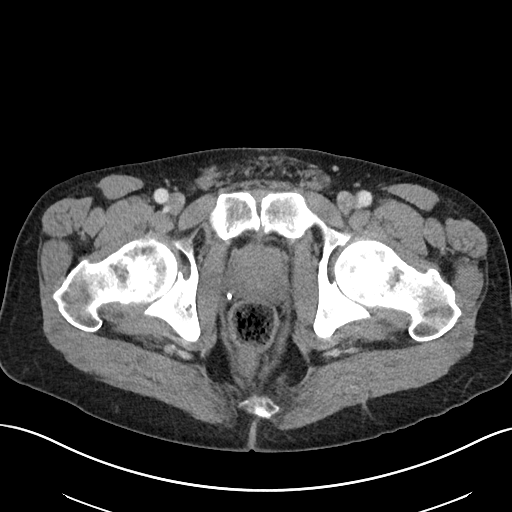
[im 25/94  soft-tissue]
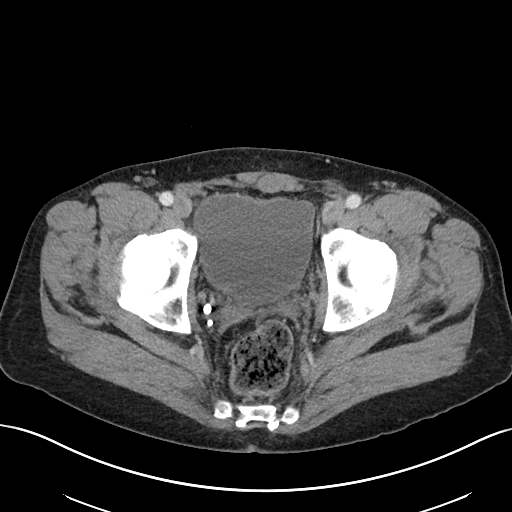
[im 32/94  soft-tissue]
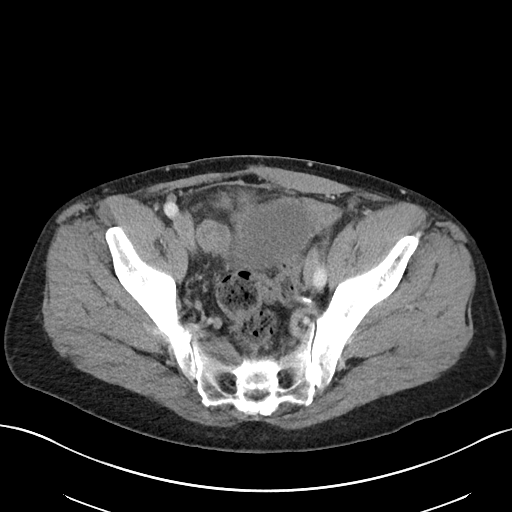
[im 38/94  soft-tissue]
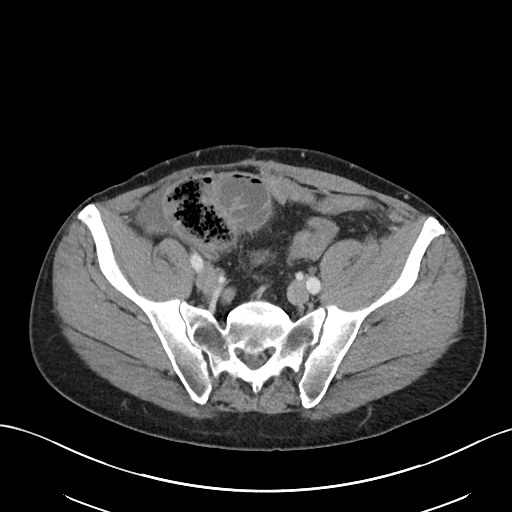
[im 50/94  soft-tissue]
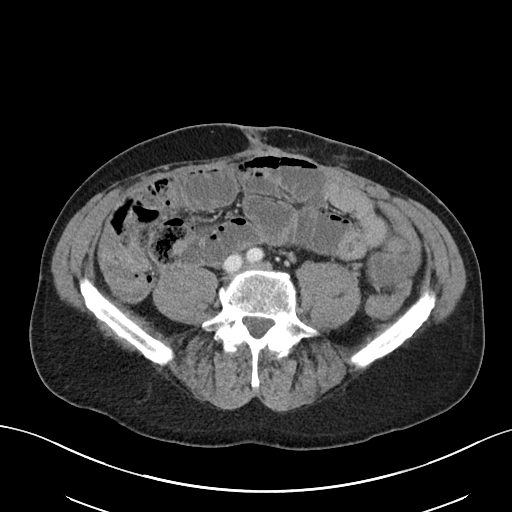
[im 56/94  soft-tissue]
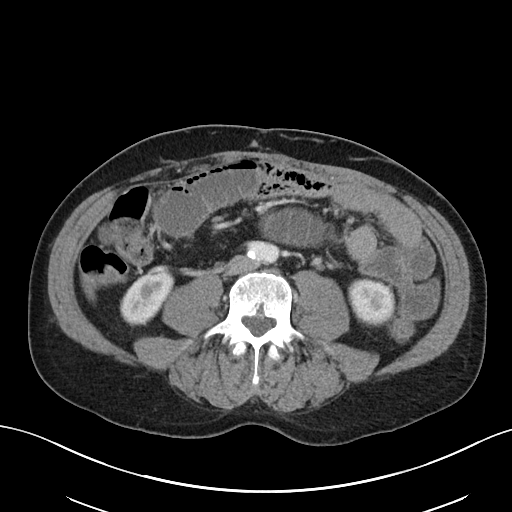
[im 63/94  soft-tissue]
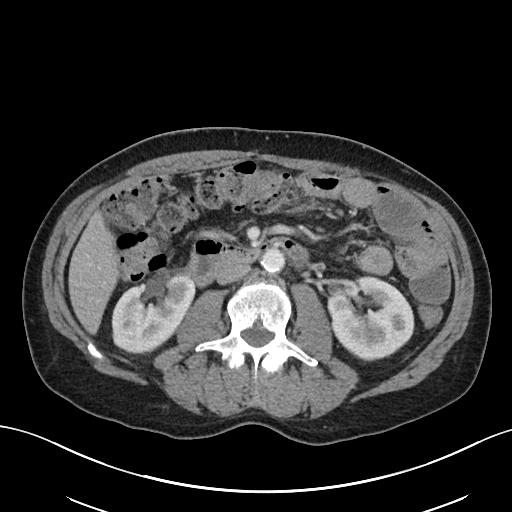
[im 63/94  bone]
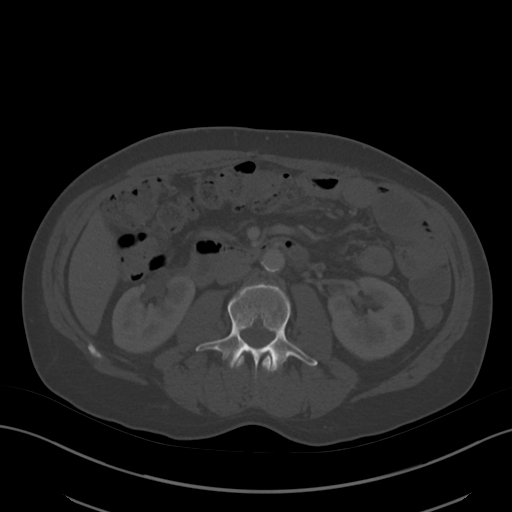
[im 69/94  soft-tissue]
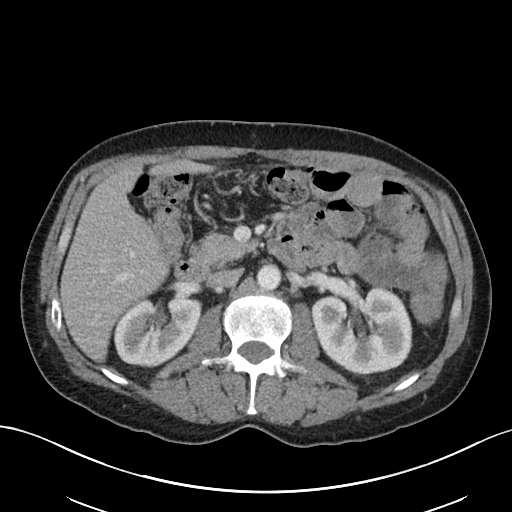
[im 75/94  soft-tissue]
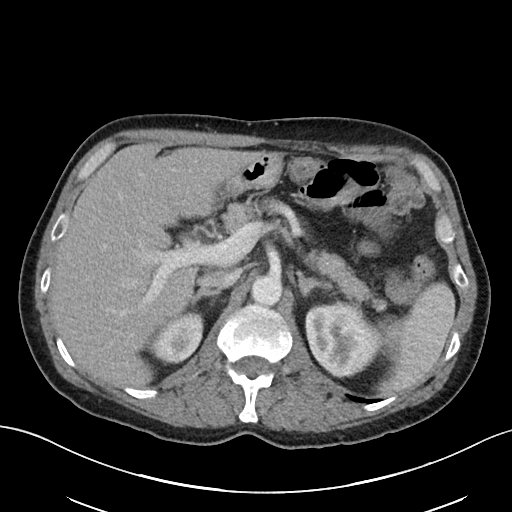
[im 81/94  soft-tissue]
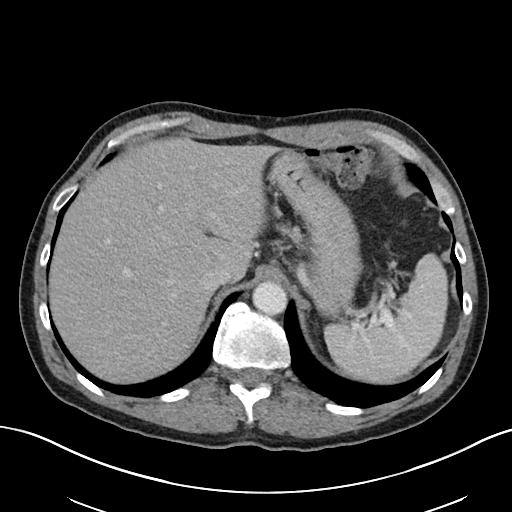
[im 87/94  soft-tissue]
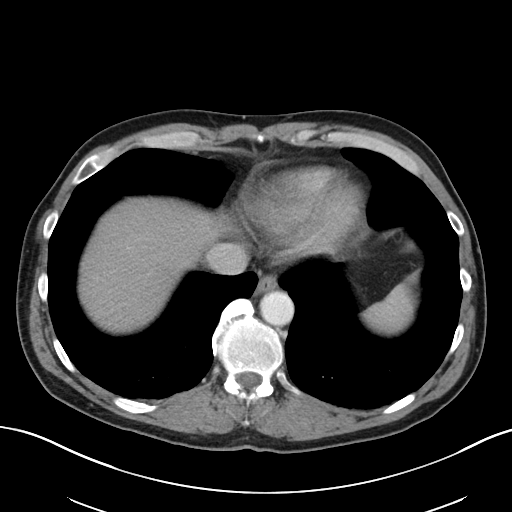

[Series 6: abdomen 3.0 mpr cor · coronal · 0.73mm/px · 3 of 86 slices shown]
[im 29/86  soft-tissue]
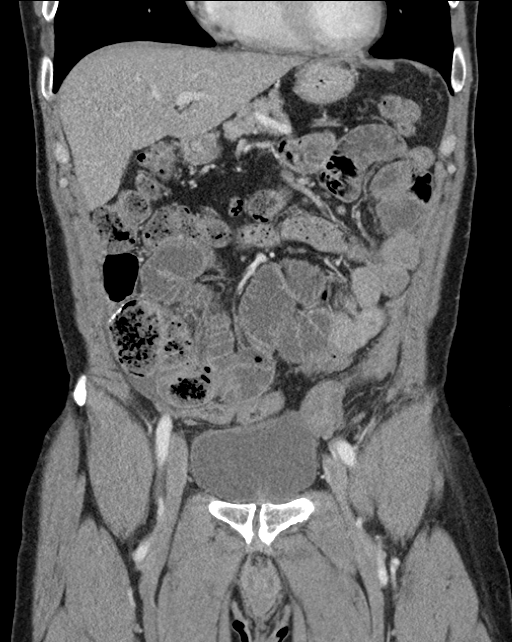
[im 38/86  soft-tissue]
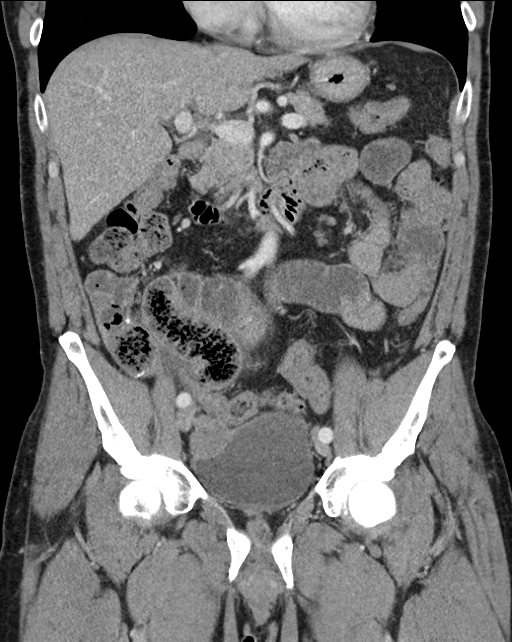
[im 48/86  soft-tissue]
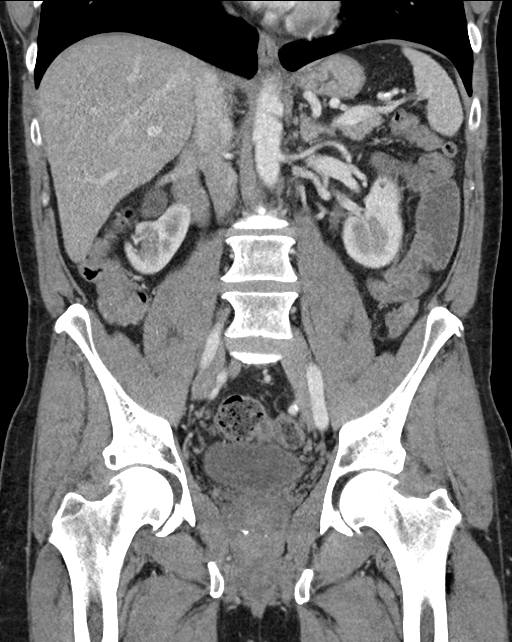

[16 of 46 positions shown; findings below may reference images not displayed]

FINDINGS: Lower chest: No acute finding. Fatty scarring at the left
ventricular apex raises the possibility of remote infarct, although
not convincingly sub endocardial or thinned.

Hepatobiliary: No visible metastatic disease.Cholecystectomy with
normal common bile duct diameter.

Pancreas: Unremarkable.

Spleen: Unremarkable.

Adrenals/Urinary Tract: Negative adrenals. No hydronephrosis or
stone. 1 cm cyst in the upper pole right kidney. Mild malrotation of
the right kidney. Mass in the low right pelvis is indistinguishable
from the right bladder wall, as below.

Stomach/Bowel: There is a right lower quadrant bowel sutures,
presumably enteroenteric or enterocolonic anastomosis related to
history of gist. At or just proximal to this point there is small
bowel distention with fluid levels and distal fecalized contents. No
visible mass at the level of obstruction. There is a mass measuring
3.2 cm in the right pelvis, along the right urinary bladder. Colonic
diverticulosis, overall mild.

Vascular/Lymphatic: Atherosclerotic calcification of the aorta. No
acute vascular finding.

Reproductive:Negative

Other: Small volume peritoneal fluid in the right lower quadrant
which is considered reactive.

Musculoskeletal: No acute or aggressive finding.
IMPRESSION: 1. Small bowel obstruction with transition point in the right lower
quadrant at or near bowel sutures. This could reflect stricture or
adhesive disease. No masslike enhancement at the level of
obstruction.
2. History of gastrointestinal stromal tumor. There is a 3.2 cm soft
tissue mass in the right lower quadrant indistinguishable from the
right bladder wall. This mass is known and enlarging based on an
abdominal CT report 07/04/2017 from [[facility]].
3. Chronic findings are noted above.
